# Patient Record
Sex: Female | Born: 1965 | Race: White | Hispanic: No | Marital: Married | Smoking: Former smoker
Health system: Southern US, Community
[De-identification: ages and names within clinical notes are randomized; demographics above are authoritative.]

## PROBLEM LIST (undated history)

## (undated) DIAGNOSIS — E162 Hypoglycemia, unspecified: Secondary | ICD-10-CM

## (undated) DIAGNOSIS — D649 Anemia, unspecified: Secondary | ICD-10-CM

## (undated) HISTORY — DX: Anemia, unspecified: D64.9

## (undated) HISTORY — DX: Hypoglycemia, unspecified: E16.2

---

## 1998-03-31 ENCOUNTER — Emergency Department (HOSPITAL_COMMUNITY): Admission: EM | Admit: 1998-03-31 | Discharge: 1998-03-31 | Payer: Self-pay | Admitting: Family Medicine

## 1998-04-05 ENCOUNTER — Emergency Department (HOSPITAL_COMMUNITY): Admission: EM | Admit: 1998-04-05 | Discharge: 1998-04-05 | Payer: Self-pay | Admitting: Internal Medicine

## 1998-04-25 ENCOUNTER — Emergency Department (HOSPITAL_COMMUNITY): Admission: EM | Admit: 1998-04-25 | Discharge: 1998-04-25 | Payer: Self-pay | Admitting: Family Medicine

## 1998-09-06 ENCOUNTER — Emergency Department (HOSPITAL_COMMUNITY): Admission: EM | Admit: 1998-09-06 | Discharge: 1998-09-06 | Payer: Self-pay | Admitting: Emergency Medicine

## 1999-05-14 ENCOUNTER — Emergency Department (HOSPITAL_COMMUNITY): Admission: EM | Admit: 1999-05-14 | Discharge: 1999-05-14 | Payer: Self-pay | Admitting: Emergency Medicine

## 1999-05-14 ENCOUNTER — Encounter: Payer: Self-pay | Admitting: Emergency Medicine

## 1999-08-11 ENCOUNTER — Emergency Department (HOSPITAL_COMMUNITY): Admission: EM | Admit: 1999-08-11 | Discharge: 1999-08-11 | Payer: Self-pay | Admitting: Emergency Medicine

## 1999-09-16 ENCOUNTER — Emergency Department (HOSPITAL_COMMUNITY): Admission: EM | Admit: 1999-09-16 | Discharge: 1999-09-16 | Payer: Self-pay | Admitting: Internal Medicine

## 1999-11-25 ENCOUNTER — Emergency Department (HOSPITAL_COMMUNITY): Admission: EM | Admit: 1999-11-25 | Discharge: 1999-11-25 | Payer: Self-pay | Admitting: Emergency Medicine

## 2001-03-22 ENCOUNTER — Emergency Department (HOSPITAL_COMMUNITY): Admission: EM | Admit: 2001-03-22 | Discharge: 2001-03-22 | Payer: Self-pay | Admitting: Emergency Medicine

## 2001-04-27 ENCOUNTER — Emergency Department (HOSPITAL_COMMUNITY): Admission: EM | Admit: 2001-04-27 | Discharge: 2001-04-27 | Payer: Self-pay

## 2002-05-29 ENCOUNTER — Emergency Department (HOSPITAL_COMMUNITY): Admission: EM | Admit: 2002-05-29 | Discharge: 2002-05-29 | Payer: Self-pay | Admitting: Emergency Medicine

## 2002-12-25 ENCOUNTER — Emergency Department (HOSPITAL_COMMUNITY): Admission: EM | Admit: 2002-12-25 | Discharge: 2002-12-25 | Payer: Self-pay | Admitting: Emergency Medicine

## 2004-02-03 ENCOUNTER — Emergency Department (HOSPITAL_COMMUNITY): Admission: EM | Admit: 2004-02-03 | Discharge: 2004-02-03 | Payer: Self-pay | Admitting: Emergency Medicine

## 2004-02-04 ENCOUNTER — Emergency Department (HOSPITAL_COMMUNITY): Admission: EM | Admit: 2004-02-04 | Discharge: 2004-02-04 | Payer: Self-pay | Admitting: Emergency Medicine

## 2006-01-12 ENCOUNTER — Other Ambulatory Visit: Admission: RE | Admit: 2006-01-12 | Discharge: 2006-01-12 | Payer: Self-pay | Admitting: Obstetrics and Gynecology

## 2007-12-16 ENCOUNTER — Emergency Department (HOSPITAL_COMMUNITY): Admission: EM | Admit: 2007-12-16 | Discharge: 2007-12-16 | Payer: Self-pay | Admitting: Emergency Medicine

## 2011-01-16 ENCOUNTER — Emergency Department (HOSPITAL_COMMUNITY)
Admission: EM | Admit: 2011-01-16 | Discharge: 2011-01-16 | Disposition: A | Discharge status: Home / Self Care | Payer: BC Managed Care – PPO | Attending: Emergency Medicine | Admitting: Emergency Medicine

## 2011-01-16 ENCOUNTER — Emergency Department (INDEPENDENT_AMBULATORY_CARE_PROVIDER_SITE_OTHER): Payer: BC Managed Care – PPO

## 2011-01-16 ENCOUNTER — Emergency Department (HOSPITAL_BASED_OUTPATIENT_CLINIC_OR_DEPARTMENT_OTHER)
Admission: EM | Admit: 2011-01-16 | Discharge: 2011-01-16 | Disposition: A | Discharge status: Home / Self Care | Payer: BC Managed Care – PPO | Attending: Emergency Medicine | Admitting: Emergency Medicine

## 2011-01-16 DIAGNOSIS — M545 Low back pain: Secondary | ICD-10-CM

## 2011-01-16 DIAGNOSIS — M549 Dorsalgia, unspecified: Secondary | ICD-10-CM | POA: Insufficient documentation

## 2011-06-10 LAB — URINALYSIS, ROUTINE W REFLEX MICROSCOPIC
Glucose, UA: NEGATIVE
Nitrite: NEGATIVE
Protein, ur: 100 — AB
Specific Gravity, Urine: >1.046 — ABNORMAL HIGH
Urobilinogen, UA: 1
pH: 5.5

## 2011-06-10 LAB — PREGNANCY, URINE: Preg Test, Ur: NEGATIVE

## 2011-06-10 LAB — BASIC METABOLIC PANEL
BUN: 13
CO2: 24
Calcium: 9.1
Chloride: 103
Creatinine, Ser: 0.68
GFR calc Af Amer: >60
GFR calc non Af Amer: >60
Glucose, Bld: 92
Potassium: 4.4
Sodium: 134 — ABNORMAL LOW

## 2011-06-10 LAB — CBC
HCT: 30.9 — ABNORMAL LOW
Hemoglobin: 9.8 — ABNORMAL LOW
RBC: 4.52
WBC: 13.4 — ABNORMAL HIGH

## 2011-06-10 LAB — URINE MICROSCOPIC-ADD ON

## 2011-12-12 ENCOUNTER — Encounter (HOSPITAL_COMMUNITY): Payer: Self-pay

## 2011-12-12 ENCOUNTER — Emergency Department (HOSPITAL_COMMUNITY)
Admission: EM | Admit: 2011-12-12 | Discharge: 2011-12-12 | Disposition: A | Discharge status: Home / Self Care | Payer: BC Managed Care – PPO | Attending: Emergency Medicine | Admitting: Emergency Medicine

## 2011-12-12 DIAGNOSIS — K117 Disturbances of salivary secretion: Secondary | ICD-10-CM | POA: Insufficient documentation

## 2011-12-12 DIAGNOSIS — K5289 Other specified noninfective gastroenteritis and colitis: Secondary | ICD-10-CM | POA: Insufficient documentation

## 2011-12-12 DIAGNOSIS — E860 Dehydration: Secondary | ICD-10-CM | POA: Insufficient documentation

## 2011-12-12 DIAGNOSIS — R197 Diarrhea, unspecified: Secondary | ICD-10-CM | POA: Insufficient documentation

## 2011-12-12 DIAGNOSIS — R000 Tachycardia, unspecified: Secondary | ICD-10-CM | POA: Insufficient documentation

## 2011-12-12 DIAGNOSIS — K529 Noninfective gastroenteritis and colitis, unspecified: Secondary | ICD-10-CM

## 2011-12-12 DIAGNOSIS — R109 Unspecified abdominal pain: Secondary | ICD-10-CM | POA: Insufficient documentation

## 2011-12-12 DIAGNOSIS — R112 Nausea with vomiting, unspecified: Secondary | ICD-10-CM | POA: Insufficient documentation

## 2011-12-12 MED ORDER — ONDANSETRON 8 MG PO TBDP: 8.0000 mg | ORAL_TABLET | Freq: Three times a day (TID) | ORAL | Status: AC | PRN

## 2011-12-12 MED ORDER — DIPHENOXYLATE-ATROPINE 2.5-0.025 MG PO TABS: 1.0000 | ORAL_TABLET | Freq: Four times a day (QID) | ORAL | Status: AC | PRN

## 2011-12-12 MED ORDER — SODIUM CHLORIDE 0.9 % IV BOLUS (SEPSIS): 2000.0000 mL | Freq: Once | INTRAVENOUS | Status: DC

## 2011-12-12 MED ORDER — LACTATED RINGERS IV BOLUS (SEPSIS)
2000.0000 mL | Freq: Once | INTRAVENOUS | Status: AC
  Administered 2011-12-12: 1000 mL via INTRAVENOUS

## 2011-12-12 MED ORDER — ONDANSETRON HCL 4 MG/2ML IJ SOLN
4.0000 mg | Freq: Once | INTRAMUSCULAR | Status: AC
Start: 2011-12-12 — End: 2011-12-12
  Administered 2011-12-12: 4 mg via INTRAVENOUS
  Filled 2011-12-12: qty 2

## 2011-12-12 MED ORDER — DIPHENOXYLATE-ATROPINE 2.5-0.025 MG PO TABS: 2.0000 | ORAL_TABLET | Freq: Once | ORAL | Status: DC

## 2011-12-12 MED ORDER — SODIUM CHLORIDE 0.9 % IV BOLUS (SEPSIS)
2000.0000 mL | Freq: Once | INTRAVENOUS | Status: AC
  Administered 2011-12-12: 1000 mL via INTRAVENOUS

## 2011-12-12 NOTE — ED Notes (Signed)
Pt given ice chips for PO trial.

## 2011-12-12 NOTE — ED Notes (Signed)
Pt states that she woke up and was vomiting and having diarrhea about 11:30pm

## 2011-12-12 NOTE — ED Provider Notes (Signed)
History     CSN: [id number]  Arrival date & time 12/12/11  0207   First MD Initiated Contact with Patient 12/12/11 0405      Chief Complaint  Patient presents with  . N/V/D     (Consider location/radiation/quality/duration/timing/severity/associated sxs/prior treatment) HPI This is a 46 year old white female who developed the sudden onset of nausea, vomiting and diarrhea about 11:30 PM yesterday. She describes the symptoms as severe. She has lost a lot of fluid. There been no mitigating or exacerbating factors. She's had abdominal cramping with this. She has a dry mouth. She's not aware if she's had a fever. She does feel chilled.  History reviewed. No pertinent past medical history.  History reviewed. No pertinent past surgical history.  History reviewed. No pertinent family history.  History  Substance Use Topics  . Smoking status: Not on file  . Smokeless tobacco: Not on file  . Alcohol Use: No    OB History    Grav Para Term Preterm Abortions TAB SAB Ect Mult Living                  Review of Systems  All other systems reviewed and are negative.    Allergies  Tramadol  Home Medications  No current outpatient prescriptions on file.  BP 151/101  Pulse 94  Temp(Src) 98.1 F (36.7 C) (Oral)  Resp 20  SpO2 100%  LMP 12/05/2011  Physical Exam General: Well-developed, well-nourished female in no acute distress; appearance consistent with age of record HENT: normocephalic, atraumatic; eyes appear somewhat sunken; dry mucous membranes and chapped lips Eyes: pupils equal round and reactive to light; extraocular muscles intact Neck: supple Heart: regular rate and rhythm; tachycardic, though tachycardia not noted in vitals Lungs: clear to auscultation bilaterally Abdomen: soft; nondistended; mild diffuse tenderness; no masses or hepatosplenomegaly; bowel sounds hyperactive Extremities: No deformity; full range of motion; pulses normal Neurologic: Awake, alert  and oriented; motor function intact in all extremities and symmetric; no facial droop Skin: Warm and dry    ED Course  Procedures (including critical care time)     MDM   Nursing notes and vitals signs, including pulse oximetry, reviewed.  Summary of this visit's results, reviewed by myself:  Labs:  Results for orders placed during the hospital encounter of 12/12/11  GLUCOSE, CAPILLARY      Component Value Range   Glucose-Capillary 94  70 - 99 (mg/dL)   8:65 AM Feels better after 2 L normal saline bolus. States her mouth still feels dry. Eating ice chips without emesis. Will give additional 2 L of lactated Ringer's. Dr. Judd Lien will make disposition.           Hanley Seamen, MD 12/12/11 [phone]

## 2011-12-12 NOTE — ED Notes (Signed)
2nd Liter NS WO hung, pt given warm blankets for comfort

## 2018-10-30 DIAGNOSIS — J302 Other seasonal allergic rhinitis: Secondary | ICD-10-CM | POA: Diagnosis not present

## 2018-10-30 DIAGNOSIS — R51 Headache: Secondary | ICD-10-CM | POA: Diagnosis not present

## 2018-10-30 DIAGNOSIS — J019 Acute sinusitis, unspecified: Secondary | ICD-10-CM | POA: Diagnosis not present

## 2018-11-28 NOTE — Progress Notes (Signed)
PCP: Patient, No Pcp Per   Chief Complaint  Patient presents with  . Gynecologic Exam    HPI:      Erin Huynh is a 53 y.o. No obstetric history on file. who LMP was Patient's last menstrual period was 11/20/2018 (exact date)., presents today for her NP annual examination.  Her menses are irregular due to perimenopause for the past yr. Menses can be monthly to every few months. Sometimes has 2 days of light spotting only, sometimes has real period for 6 days with dime to quarter sized clots. Usually has dysmen, improved with NSAIDs. No BTB. Pt's current menses heavier and longer, changing pads/tampons every few hrs with occas gushes and more clots.  Also has intermittent bloating, with and without menses. No constipation.  She does not have vasomotor sx.   Sex activity: single partner, She does not have vaginal dryness.  Last Pap: not recent; no hx of abn.  Last mammogram: not recent There is no FH of breast cancer. There is no FH of ovarian cancer. The patient does not do self-breast exams.  Colonoscopy: never. Occas has epigastric pain, not necessarily related to eating.   Tobacco use: quit tobacco  Alcohol use: none Exercise: very active  She does get adequate calcium but not Vitamin D in her diet.  Labs at work.   Past Medical History:  Diagnosis Date  . Anemia   . Hypoglycemia     Past Surgical History:  Procedure Laterality Date  . CESAREAN SECTION     3244,0102    Family History  Problem Relation Age of Onset  . Colon cancer Maternal Grandfather 18  . Skin cancer Paternal Grandfather 68  . Breast cancer Neg Hx     Social History   Socioeconomic History  . Marital status: Married    Spouse name: Not on file  . Number of children: Not on file  . Years of education: Not on file  . Highest education level: Not on file  Occupational History  . Not on file  Social Needs  . Financial resource strain: Not on file  . Food insecurity:    Worry:  Not on file    Inability: Not on file  . Transportation needs:    Medical: Not on file    Non-medical: Not on file  Tobacco Use  . Smoking status: Former Games developer  . Smokeless tobacco: Never Used  Substance and Sexual Activity  . Alcohol use: No  . Drug use: Never  . Sexual activity: Not Currently    Birth control/protection: None  Lifestyle  . Physical activity:    Days per week: Not on file    Minutes per session: Not on file  . Stress: Not on file  Relationships  . Social connections:    Talks on phone: Not on file    Gets together: Not on file    Attends religious service: Not on file    Active member of club or organization: Not on file    Attends meetings of clubs or organizations: Not on file    Relationship status: Not on file  . Intimate partner violence:    Fear of current or ex partner: Not on file    Emotionally abused: Not on file    Physically abused: Not on file    Forced sexual activity: Not on file  Other Topics Concern  . Not on file  Social History Narrative  . Not on file    No  outpatient medications prior to visit.   No facility-administered medications prior to visit.        ROS:  Review of Systems  Constitutional: Negative for fatigue, fever and unexpected weight change.  Respiratory: Negative for cough, shortness of breath and wheezing.   Cardiovascular: Negative for chest pain, palpitations and leg swelling.  Gastrointestinal: Negative for blood in stool, constipation, diarrhea, nausea and vomiting.  Endocrine: Negative for cold intolerance, heat intolerance and polyuria.  Genitourinary: Positive for difficulty urinating and vaginal bleeding. Negative for dyspareunia, dysuria, flank pain, frequency, genital sores, hematuria, menstrual problem, pelvic pain, urgency, vaginal discharge and vaginal pain.  Musculoskeletal: Positive for arthralgias. Negative for back pain, joint swelling and myalgias.  Skin: Negative for rash.  Neurological:  Positive for headaches. Negative for dizziness, syncope, light-headedness and numbness.  Hematological: Negative for adenopathy.  Psychiatric/Behavioral: Positive for agitation. Negative for confusion, sleep disturbance and suicidal ideas. The patient is not nervous/anxious.    BREAST: No symptoms    Objective: BP 140/90   Pulse 77   Ht 4\' 8"  (1.422 m)   Wt 105 lb (47.6 kg)   LMP 11/20/2018 (Exact Date)   BMI 23.54 kg/m    Physical Exam Constitutional:      Appearance: She is well-developed.  Genitourinary:     Vulva, uterus, right adnexa and left adnexa normal.     No vulval lesion or tenderness noted.     Vaginal bleeding present.     No vaginal discharge, erythema or tenderness.     Cervical bleeding present.     No cervical polyp.     Uterus is not enlarged or tender.     No uterine mass detected.    No right or left adnexal mass present.     Right adnexa not tender.     Left adnexa not tender.  Neck:     Musculoskeletal: Normal range of motion.     Thyroid: No thyromegaly.  Cardiovascular:     Rate and Rhythm: Normal rate and regular rhythm.     Heart sounds: Normal heart sounds. No murmur.  Pulmonary:     Effort: Pulmonary effort is normal.     Breath sounds: Normal breath sounds.  Chest:     Breasts:        Right: No mass, nipple discharge, skin change or tenderness.        Left: No mass, nipple discharge, skin change or tenderness.  Abdominal:     Palpations: Abdomen is soft.     Tenderness: There is no abdominal tenderness. There is no guarding.  Musculoskeletal: Normal range of motion.  Neurological:     General: No focal deficit present.     Mental Status: She is alert and oriented to person, place, and time.     Cranial Nerves: No cranial nerve deficit.  Skin:    General: Skin is warm and dry.  Psychiatric:        Mood and Affect: Mood normal.        Behavior: Behavior normal.        Thought Content: Thought content normal.        Judgment:  Judgment normal.  Vitals signs reviewed.     Assessment/Plan:  Encounter for annual routine gynecological examination  Cervical cancer screening - Plan: Cytology - PAP  Screening for HPV (human papillomavirus) - Plan: Cytology - PAP  Screening for breast cancer - Pt to sched mammo - Plan: MM 3D SCREEN BREAST BILATERAL  Irregular menses -  Due to perimenopause. F/u prn DUB  Perimenopause  Screening for colon cancer - Refer to GI for scr colonoscopy due to age. - Plan: Ambulatory referral to Gastroenterology         GYN counsel breast self exam, mammography screening, menopause, adequate intake of calcium and vitamin D, diet and exercise    F/U  Return in about 1 year (around 11/29/2019).  Josceline Chenard B. Zakk Borgen, PA-C 11/29/2018 9:28 AM

## 2018-11-29 ENCOUNTER — Other Ambulatory Visit: Payer: Self-pay

## 2018-11-29 ENCOUNTER — Other Ambulatory Visit (HOSPITAL_COMMUNITY)
Admission: RE | Admit: 2018-11-29 | Discharge: 2018-11-29 | Disposition: A | Discharge status: Home / Self Care | Payer: BLUE CROSS/BLUE SHIELD | Source: Ambulatory Visit | Attending: Obstetrics and Gynecology | Admitting: Obstetrics and Gynecology

## 2018-11-29 ENCOUNTER — Ambulatory Visit (INDEPENDENT_AMBULATORY_CARE_PROVIDER_SITE_OTHER): Payer: BLUE CROSS/BLUE SHIELD | Admitting: Obstetrics and Gynecology

## 2018-11-29 ENCOUNTER — Encounter: Payer: Self-pay | Admitting: Obstetrics and Gynecology

## 2018-11-29 VITALS — BP 140/90 | HR 77 | Ht <= 58 in | Wt 105.0 lb

## 2018-11-29 DIAGNOSIS — Z124 Encounter for screening for malignant neoplasm of cervix: Secondary | ICD-10-CM | POA: Insufficient documentation

## 2018-11-29 DIAGNOSIS — Z1151 Encounter for screening for human papillomavirus (HPV): Secondary | ICD-10-CM

## 2018-11-29 DIAGNOSIS — Z01419 Encounter for gynecological examination (general) (routine) without abnormal findings: Secondary | ICD-10-CM | POA: Diagnosis not present

## 2018-11-29 DIAGNOSIS — Z113 Encounter for screening for infections with a predominantly sexual mode of transmission: Secondary | ICD-10-CM | POA: Diagnosis not present

## 2018-11-29 DIAGNOSIS — Z1239 Encounter for other screening for malignant neoplasm of breast: Secondary | ICD-10-CM

## 2018-11-29 DIAGNOSIS — Z1211 Encounter for screening for malignant neoplasm of colon: Secondary | ICD-10-CM

## 2018-11-29 DIAGNOSIS — N951 Menopausal and female climacteric states: Secondary | ICD-10-CM

## 2018-11-29 DIAGNOSIS — N926 Irregular menstruation, unspecified: Secondary | ICD-10-CM

## 2018-11-29 NOTE — Patient Instructions (Addendum)
I value your feedback and entrusting us with your care. If you get a [facility] patient survey, I would appreciate you taking the time to let us know about your experience today. Thank you!  [facility]: [phone]    

## 2018-12-03 LAB — CYTOLOGY - PAP
DIAGNOSIS: UNDETERMINED — AB
HPV: NOT DETECTED

## 2018-12-06 ENCOUNTER — Encounter: Payer: Self-pay | Admitting: Obstetrics and Gynecology

## 2018-12-27 ENCOUNTER — Encounter: Payer: Self-pay | Admitting: *Deleted

## 2019-08-27 DIAGNOSIS — R509 Fever, unspecified: Secondary | ICD-10-CM | POA: Diagnosis not present

## 2019-08-27 DIAGNOSIS — Z20828 Contact with and (suspected) exposure to other viral communicable diseases: Secondary | ICD-10-CM | POA: Diagnosis not present

## 2019-08-27 DIAGNOSIS — J3489 Other specified disorders of nose and nasal sinuses: Secondary | ICD-10-CM | POA: Diagnosis not present

## 2021-06-01 ENCOUNTER — Encounter (HOSPITAL_BASED_OUTPATIENT_CLINIC_OR_DEPARTMENT_OTHER): Payer: Self-pay

## 2021-06-01 ENCOUNTER — Emergency Department (HOSPITAL_BASED_OUTPATIENT_CLINIC_OR_DEPARTMENT_OTHER)
Admission: EM | Admit: 2021-06-01 | Discharge: 2021-06-01 | Disposition: A | Discharge status: Home / Self Care | Payer: BC Managed Care – PPO | Attending: Emergency Medicine | Admitting: Emergency Medicine

## 2021-06-01 ENCOUNTER — Emergency Department (HOSPITAL_BASED_OUTPATIENT_CLINIC_OR_DEPARTMENT_OTHER): Payer: BC Managed Care – PPO

## 2021-06-01 ENCOUNTER — Ambulatory Visit: Admission: EM | Admit: 2021-06-01 | Discharge: 2021-06-01 | Payer: BC Managed Care – PPO

## 2021-06-01 ENCOUNTER — Other Ambulatory Visit: Payer: Self-pay

## 2021-06-01 DIAGNOSIS — Z87891 Personal history of nicotine dependence: Secondary | ICD-10-CM | POA: Insufficient documentation

## 2021-06-01 DIAGNOSIS — G8929 Other chronic pain: Secondary | ICD-10-CM

## 2021-06-01 DIAGNOSIS — R519 Headache, unspecified: Secondary | ICD-10-CM | POA: Diagnosis not present

## 2021-06-01 DIAGNOSIS — G43809 Other migraine, not intractable, without status migrainosus: Secondary | ICD-10-CM | POA: Diagnosis not present

## 2021-06-01 DIAGNOSIS — G43909 Migraine, unspecified, not intractable, without status migrainosus: Secondary | ICD-10-CM | POA: Insufficient documentation

## 2021-06-01 LAB — CBC WITH DIFFERENTIAL/PLATELET
Abs Immature Granulocytes: 0.02 10*3/uL (ref 0.00–0.07)
Basophils Absolute: 0 10*3/uL (ref 0.0–0.1)
Basophils Relative: 0 %
Eosinophils Absolute: 0.2 10*3/uL (ref 0.0–0.5)
Eosinophils Relative: 2 %
HCT: 40 % (ref 36.0–46.0)
Hemoglobin: 13 g/dL (ref 12.0–15.0)
Immature Granulocytes: 0 %
Lymphocytes Relative: 22 %
Lymphs Abs: 2.1 10*3/uL (ref 0.7–4.0)
MCH: 28.3 pg (ref 26.0–34.0)
MCHC: 32.5 g/dL (ref 30.0–36.0)
MCV: 87.1 fL (ref 80.0–100.0)
Monocytes Absolute: 0.4 10*3/uL (ref 0.1–1.0)
Monocytes Relative: 4 %
Neutro Abs: 7 10*3/uL (ref 1.7–7.7)
Neutrophils Relative %: 72 %
Platelets: 258 10*3/uL (ref 150–400)
RBC: 4.59 MIL/uL (ref 3.87–5.11)
RDW: 15.8 % — ABNORMAL HIGH (ref 11.5–15.5)
WBC: 9.8 10*3/uL (ref 4.0–10.5)
nRBC: 0 % (ref 0.0–0.2)

## 2021-06-01 LAB — COMPREHENSIVE METABOLIC PANEL
ALT: 15 U/L (ref 0–44)
AST: 17 U/L (ref 15–41)
Albumin: 4.2 g/dL (ref 3.5–5.0)
Alkaline Phosphatase: 56 U/L (ref 38–126)
Anion gap: 9 (ref 5–15)
BUN: 13 mg/dL (ref 6–20)
CO2: 27 mmol/L (ref 22–32)
Calcium: 9.9 mg/dL (ref 8.9–10.3)
Chloride: 103 mmol/L (ref 98–111)
Creatinine, Ser: 0.55 mg/dL (ref 0.44–1.00)
GFR, Estimated: >60 mL/min (ref >60)
Glucose, Bld: 92 mg/dL (ref 70–99)
Potassium: 3.9 mmol/L (ref 3.5–5.1)
Sodium: 139 mmol/L (ref 135–145)
Total Bilirubin: 0.2 mg/dL — ABNORMAL LOW (ref 0.3–1.2)
Total Protein: 7.1 g/dL (ref 6.5–8.1)

## 2021-06-01 MED ORDER — DIPHENHYDRAMINE HCL 50 MG/ML IJ SOLN
50.0000 mg | Freq: Once | INTRAMUSCULAR | Status: AC
Start: 2021-06-01 — End: 2021-06-01
  Administered 2021-06-01: 50 mg via INTRAVENOUS
  Filled 2021-06-01: qty 1

## 2021-06-01 MED ORDER — IOHEXOL 350 MG/ML SOLN
75.0000 mL | Freq: Once | INTRAVENOUS | Status: AC | PRN
  Administered 2021-06-01: 75 mL via INTRAVENOUS

## 2021-06-01 MED ORDER — METOCLOPRAMIDE HCL 5 MG/ML IJ SOLN
10.0000 mg | Freq: Once | INTRAMUSCULAR | Status: AC
  Administered 2021-06-01: 10 mg via INTRAVENOUS
  Filled 2021-06-01: qty 2

## 2021-06-01 NOTE — ED Notes (Signed)
Patient is being discharged from the Urgent Care and sent to the Emergency Department via POV . Per Boddu, patient is in need of higher level of care to rule out aneurysm. Patient is aware and verbalizes understanding of plan of care.  Vitals:   06/01/21 1328 06/01/21 1331  BP:  130/86  Pulse: 86 75  Resp: 16 16  Temp: 98.1 F (36.7 C) 98.3 F (36.8 C)  SpO2: 96% 96%

## 2021-06-01 NOTE — ED Notes (Signed)
Patient transported to CT 

## 2021-06-01 NOTE — ED Provider Notes (Signed)
Patient presents with concerns for neck injury/aneurysm due to severe headache which has worsened over the last 2 weeks. Patient reports HA over the last 2 months. No known injury, but unsure.  Will go to the ED for evaluation.  No charge visit. Stable on discharge.   Amalia Greenhouse, FNP 06/01/21 1340

## 2021-06-01 NOTE — Discharge Instructions (Signed)
Schedule a follow-up with neurology for additional evaluation of headaches.  Your work-up today was reassuring and there were no signs of aneurysm.  Continue taking Excedrin and Tylenol as needed.

## 2021-06-01 NOTE — ED Triage Notes (Signed)
Patient presents to Urgent Care with complaints of headache and neck pain x 2 months from injuring her neck at work. Pt states she has had neck pain and migraines. Treating migraines with Excedrin migraine.

## 2021-06-01 NOTE — Discharge Instructions (Addendum)
Your current condition warrants further evaluation and/or treatment which exceed services available to you in this urgent care setting. I have discussed with you your currrent condition and the need for further evaluation and/or treatment in an emergency department setting. In response to my medical recommendation, you have opted to go to the emergency department. 
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
## 2021-06-01 NOTE — ED Triage Notes (Signed)
Pt c/o headache and neck pain X2 months. Treating migraines with Excedrin migraine with no relief. NAD in triage. Pt ambulatory with steady gate. Pt denies light sensitivity but does report on and off "blurriness"

## 2021-06-01 NOTE — ED Notes (Signed)
Dc home ambulatory. Pt voiced understanding of dc instructions.

## 2021-06-06 ENCOUNTER — Ambulatory Visit (INDEPENDENT_AMBULATORY_CARE_PROVIDER_SITE_OTHER): Payer: BC Managed Care – PPO | Admitting: Neurology

## 2021-06-06 ENCOUNTER — Encounter: Payer: Self-pay | Admitting: Neurology

## 2021-06-06 VITALS — BP 113/75 | HR 76 | Ht <= 58 in | Wt 99.0 lb

## 2021-06-06 DIAGNOSIS — G44221 Chronic tension-type headache, intractable: Secondary | ICD-10-CM

## 2021-06-06 DIAGNOSIS — Z0000 Encounter for general adult medical examination without abnormal findings: Secondary | ICD-10-CM

## 2021-06-06 MED ORDER — SUMATRIPTAN SUCCINATE 50 MG PO TABS: 50.0000 mg | ORAL_TABLET | ORAL | 0 refills | Status: DC | PRN

## 2021-06-06 MED ORDER — METHYLPREDNISOLONE 4 MG PO TBPK: ORAL_TABLET | ORAL | 0 refills | Status: DC

## 2021-06-06 MED ORDER — AMITRIPTYLINE HCL 25 MG PO TABS
25.0000 mg | ORAL_TABLET | Freq: Every day | ORAL | 1 refills | Status: DC
Start: 2021-06-06 — End: 2021-09-10

## 2021-06-06 NOTE — Patient Instructions (Addendum)
Stop Excedrin   Start Medrol dose pack as below  Day 1: Take 6 tabs in the morning  Day 2: Take 5 tabs in the morning  Day 3: Take 4 tabs in the morning  Day 4: Take 3 tabs in the morning  Day 5: Take 2 tabs in the morning  Day 6: Tale 1 tab in the morning  Start Elavil as below:  Week 1: Take 1/2 tablet nightly  Week 2: Take 1 tablet nightly  Week 3: Increase to 2 tablets nightly   Start Sumatriptan for severe pain, can take an additional table after 2 hours in the pain not resolved   Brain MRI with and without contrast, I will contact you to discuss MRI results   Return in 3 months

## 2021-06-06 NOTE — Progress Notes (Signed)
[facility]  PATIENT: Erin Huynh DOB: [birth year]  REFERRING CLINICIAN: No ref. provider found HISTORY FROM: Patient  REASON FOR VISIT: Daily Headaches    HISTORICAL  CHIEF COMPLAINT:  Chief Complaint  Patient presents with   New Patient (Initial Visit)    Rm 12, states her her headache will not go away, takes Excedrin every 4-6 hrs     HISTORY OF PRESENT ILLNESS:  This is a 55 year old woman with no reported past medical history who is presenting with daily headaches.  Patient stated for the past 6 to 7 months has been having migraine headaches but in the last 2 months has been more frequent.  She is having daily headaches.  Headaches usually start in the back of the neck on the right side, described as sharp pain that would radiate to the top of the head.  On occasion she will have bifrontal headaches.  She reports has been taking Excedrin migraine headache every 4-6 hours every day for her headaches.   She is also complaining of right-sided neck pain which is getting worse.  With the headaches she has nausea but only on occasion but no vomiting no photophobia no phonophobia.  She was seen in the ED recently on this September 17 for the same headaches, had a CTA head and neck which was negative did not show any evidence of aneurysm.   On further questioning patient stated her mood is little low, she is undergoing a lot of stress.  She left her husband 2 years ago, she is living with her brother in the basement.  Stated that she started her divorce paper 73-monthago but is been difficult for her.  She currently works second shift between 20 11 PM.  Also reported her sleep is terrible preparation.  She goes to bed around midnight and she will wake up almost every hour. She usually does not see a doctor, she does not have a primary care doctor currently.   Headache History and Characteristics: Onset: 7 months ago, worse in the past 2 months. Location: back of  head, in the right side, will shoot pain on top. Every once a while, headaches are frontal Quality: stabbing, throbbing.  Intensity: 7/10.  Duration: Constant for the past 2 months,  Migrainous Features: nausea  Aura: No  History of brain injury or tumor: No  Family history: Mother with migraines, grandmother (Brain aneurysm) Motion sickness: no Cardiac history: no  OTC: tylenol, Excedrin, Mucinex   Caffeine: yes  Sleep: Not  good, work second shift 3 -11, goes to bed by midnight, but will wake every hour Mood/ Stress: Live with brother, going thru a divorce,   Prior prophylaxis: Propranolol: No  Verapamil:No TCA: No Topamax: No Depakote: No Effexor: No Cymbalta: No Neurontin:No  Prior abortives: Triptan: No Anti-emetic: No Steroids: No Ergotamine suppository: No  Prior interventions Trigger point injections: botox injections:   OTHER MEDICAL CONDITIONS: None    REVIEW OF SYSTEMS: Full 14 system review of systems performed and negative with exception of: as noted in the HPI  ALLERGIES: Allergies  Allergen Reactions   Tramadol     HOME MEDICATIONS: Outpatient Medications Prior to Visit  Medication Sig Dispense Refill   aspirin-acetaminophen-caffeine (EXCEDRIN MIGRAINE) 250-250-65 MG tablet Take by mouth every 6 (six) hours as needed for headache.     No facility-administered medications prior to visit.    PAST MEDICAL HISTORY: Past Medical History:  Diagnosis Date   Anemia    Hypoglycemia  PAST SURGICAL HISTORY: Past Surgical History:  Procedure Laterality Date   CESAREAN SECTION     [phone]    FAMILY HISTORY: Family History  Problem Relation Age of Onset   Colon cancer Maternal Grandfather 49   Skin cancer Paternal Grandfather 70   Breast cancer Neg Hx     SOCIAL HISTORY: Social History   Socioeconomic History   Marital status: Married    Spouse name: Not on file   Number of children: Not on file   Years of education: Not on  file   Highest education level: Not on file  Occupational History   Not on file  Tobacco Use   Smoking status: Former   Smokeless tobacco: Never  Vaping Use   Vaping Use: Never used  Substance and Sexual Activity   Alcohol use: No   Drug use: Never   Sexual activity: Not Currently    Birth control/protection: None  Other Topics Concern   Not on file  Social History Narrative   Not on file   Social Determinants of Health   Financial Resource Strain: Not on file  Food Insecurity: Not on file  Transportation Needs: Not on file  Physical Activity: Not on file  Stress: Not on file  Social Connections: Not on file  Intimate Partner Violence: Not on file     PHYSICAL EXAM  GENERAL EXAM/CONSTITUTIONAL: Vitals:  Vitals:   06/06/21 1256  BP: 113/75  Pulse: 76  Weight: 99 lb (44.9 kg)  Height: 4' 8" (1.422 m)   Body mass index is 22.2 kg/m. Wt Readings from Last 3 Encounters:  06/06/21 99 lb (44.9 kg)  06/01/21 95 lb (43.1 kg)  11/29/18 105 lb (47.6 kg)   Patient is in no distress; well developed, nourished and groomed; neck is supple but tearful and crying during examination  EYES: Pupils round and reactive to light, Visual fields full to confrontation, Extraocular movements intacts,   MUSCULOSKELETAL: Gait, strength, tone, movements noted in Neurologic exam below  NEUROLOGIC: MENTAL STATUS:  awake, alert, oriented to person, place and time recent and remote memory intact normal attention and concentration language fluent, comprehension intact, naming intact fund of knowledge appropriate  CRANIAL NERVE:  2nd - no papilledema or hemorrhages on fundoscopic exam 2nd, 3rd, 4th, 6th - pupils equal and reactive to light, visual fields full to confrontation, extraocular muscles intact, no nystagmus 5th - facial sensation symmetric 7th - facial strength symmetric 8th - hearing intact 9th - palate elevates symmetrically, uvula midline 11th - shoulder shrug  symmetric 12th - tongue protrusion midline  MOTOR:  normal bulk and tone, full strength in the BUE, BLE  SENSORY:  normal and symmetric to light touch, pinprick, temperature, vibration  COORDINATION:  finger-nose-finger, fine finger movements normal  REFLEXES:  deep tendon reflexes present and symmetric  GAIT/STATION:  normal    DIAGNOSTIC DATA (LABS, IMAGING, TESTING) - I reviewed patient records, labs, notes, testing and imaging myself where available.  Lab Results  Component Value Date   WBC 9.8 06/01/2021   HGB 13.0 06/01/2021   HCT 40.0 06/01/2021   MCV 87.1 06/01/2021   PLT 258 06/01/2021      Component Value Date/Time   NA 139 06/01/2021 1534   K 3.9 06/01/2021 1534   CL 103 06/01/2021 1534   CO2 27 06/01/2021 1534   GLUCOSE 92 06/01/2021 1534   BUN 13 06/01/2021 1534   CREATININE 0.55 06/01/2021 1534   CALCIUM 9.9 06/01/2021 1534   PROT 7.1 06/01/2021  1534   ALBUMIN 4.2 06/01/2021 1534   AST 17 06/01/2021 1534   ALT 15 06/01/2021 1534   ALKPHOS 56 06/01/2021 1534   BILITOT 0.2 (L) 06/01/2021 1534   GFRNONAA >60 06/01/2021 1534   GFRAA  12/16/2007 1030    >60        The eGFR has been calculated using the MDRD equation. This calculation has not been validated in all clinical   No results found for: CHOL, HDL, LDLCALC, LDLDIRECT, TRIG, CHOLHDL No results found for: HGBA1C No results found for: VITAMINB12 No results found for: TSH  CTA Head and neck 06/01/21 1. No acute intracranial pathology. 2. Patent vasculature of the head and neck with no stenosis, occlusion, dissection, or aneurysm.    ASSESSMENT AND PLAN  55 y.o. year old female with no reported past medical history who is presenting with headache for the past 59-month, which is worse now for the past 2 months.  She patient is describing daily headaches not improved with Excedrin migraine.  She also reported going thru a lot of stress, she is going through a divorce, currently lives with  her brother.  I believe patient have chronic tension type headache, but she is over 50 and I will get a MRI brain to rule out any intracranial pathology that can explain her headache.  I also think that she is under a lot of stress, possibly going to a depression which is worsening her headaches.  I will start her on amitriptyline with a plan to increase it to control her depressive symptoms (up to 200 mg).  I will contact the patient to discuss the MRI result otherwise I will see her in 3 months for follow-up.  I will also refer her for a primary care physician.    1. Chronic tension-type headache, intractable   2. Healthcare maintenance     PLAN: Stop Excedrin   Start Medrol dose pack as below  Day 1: Take 6 tabs in the morning  Day 2: Take 5 tabs in the morning  Day 3: Take 4 tabs in the morning  Day 4: Take 3 tabs in the morning  Day 5: Take 2 tabs in the morning  Day 6: Tale 1 tab in the morning  Start Elavil as below:  Week 1: Take 1/2 tablet nightly  Week 2: Take 1 tablet nightly  Week 3: Increase to 2 tablets nightly   Start Sumatriptan for severe pain, can take an additional table after 2 hours in the pain not resolved   Brain MRI with and without contrast, I will contact you to discuss MRI results   Return in 3 months   Orders Placed This Encounter  Procedures   [place]  Ambulatory referral to [facility] this encounter  Medications   methylPREDNISolone (MEDROL DOSEPAK) 4 MG TBPK tablet    Sig: Day 1: Take 6 tabs in the morning Day 2: Take 5 tabs in the morning Day 3: Take 4 tabs in the morning Day 4: Take 3 tabs in the morning Day 5: Take 2 tabs in the morning Day 6: Tale 1 tab in the morning    Dispense:  21 tablet    Refill:  0   amitriptyline (ELAVIL) 25 MG tablet    Sig: Take 1 tablet (25 mg total) by mouth at bedtime.    Dispense:  60 tablet    Refill:  1   SUMAtriptan (IMITREX) 50 MG  tablet    Sig: Take 1 tablet (50 mg  total) by mouth every 2 (two) hours as needed for migraine. May repeat in 2 hours if headache persists or recurs.    Dispense:  10 tablet    Refill:  0    Return in about 3 months (around 09/05/2021).    Alric Ran, MD 06/06/2021, 1:45 PM  [facility] [address] [place], [place] [address] [phone]

## 2021-06-18 ENCOUNTER — Telehealth: Payer: Self-pay | Admitting: Neurology

## 2021-06-18 NOTE — Telephone Encounter (Signed)
MR Brain w/wo contrast Dr. Sena Slate Berkley Harvey: [phone] (exp. 06/17/21 to 08/15/21). Patient is scheduled at [facility] for 06/25/21.

## 2021-06-25 ENCOUNTER — Ambulatory Visit: Payer: BC Managed Care – PPO

## 2021-06-25 DIAGNOSIS — G44221 Chronic tension-type headache, intractable: Secondary | ICD-10-CM

## 2021-06-25 MED ORDER — GADOBENATE DIMEGLUMINE 529 MG/ML IV SOLN
10.0000 mL | Freq: Once | INTRAVENOUS | Status: AC | PRN
  Administered 2021-06-25: 10 mL via INTRAVENOUS

## 2021-06-26 ENCOUNTER — Telehealth: Payer: Self-pay | Admitting: *Deleted

## 2021-06-26 NOTE — Progress Notes (Signed)
Please call and advise the patient that her recent scan we did was within normal limits. We did a brain MRI which showed no acute findings. In particular, there were no acute findings, such as a stroke, or mass or blood products. No further action is required on this test at this time. Please remind patient to keep any upcoming appointments or tests and to call us with any interim questions, concerns, problems or updates. Thanks,   Windell Norfolk, MD

## 2021-06-26 NOTE — Telephone Encounter (Signed)
-----   Message from Windell Norfolk, MD sent at 06/26/2021  1:55 PM EDT ----- Please call and advise the patient that her recent scan we did was within normal limits. We did a brain MRI which showed no acute findings. In particular, there were no acute findings, such as a stroke, or mass or blood products. No further action is required on this test at this time. Please remind patient to keep any upcoming appointments or tests and to call us with any interim questions, concerns, problems or updates. Thanks,   Windell Norfolk, MD

## 2021-06-26 NOTE — Telephone Encounter (Signed)
I spoke to the patient. She verbalized understanding of the MRI brain findings. She will keep pending appts.

## 2021-07-01 ENCOUNTER — Encounter: Payer: Self-pay | Admitting: Neurology

## 2021-07-22 DIAGNOSIS — M542 Cervicalgia: Secondary | ICD-10-CM | POA: Diagnosis not present

## 2021-07-22 DIAGNOSIS — M9902 Segmental and somatic dysfunction of thoracic region: Secondary | ICD-10-CM | POA: Diagnosis not present

## 2021-07-22 DIAGNOSIS — M9901 Segmental and somatic dysfunction of cervical region: Secondary | ICD-10-CM | POA: Diagnosis not present

## 2021-07-22 DIAGNOSIS — G4486 Cervicogenic headache: Secondary | ICD-10-CM | POA: Diagnosis not present

## 2021-07-31 DIAGNOSIS — M9902 Segmental and somatic dysfunction of thoracic region: Secondary | ICD-10-CM | POA: Diagnosis not present

## 2021-07-31 DIAGNOSIS — M542 Cervicalgia: Secondary | ICD-10-CM | POA: Diagnosis not present

## 2021-07-31 DIAGNOSIS — M9901 Segmental and somatic dysfunction of cervical region: Secondary | ICD-10-CM | POA: Diagnosis not present

## 2021-07-31 DIAGNOSIS — G4486 Cervicogenic headache: Secondary | ICD-10-CM | POA: Diagnosis not present

## 2021-08-05 DIAGNOSIS — G4486 Cervicogenic headache: Secondary | ICD-10-CM | POA: Diagnosis not present

## 2021-08-05 DIAGNOSIS — M542 Cervicalgia: Secondary | ICD-10-CM | POA: Diagnosis not present

## 2021-08-05 DIAGNOSIS — M9901 Segmental and somatic dysfunction of cervical region: Secondary | ICD-10-CM | POA: Diagnosis not present

## 2021-08-05 DIAGNOSIS — M9902 Segmental and somatic dysfunction of thoracic region: Secondary | ICD-10-CM | POA: Diagnosis not present

## 2021-08-22 DIAGNOSIS — M9901 Segmental and somatic dysfunction of cervical region: Secondary | ICD-10-CM | POA: Diagnosis not present

## 2021-08-22 DIAGNOSIS — M9902 Segmental and somatic dysfunction of thoracic region: Secondary | ICD-10-CM | POA: Diagnosis not present

## 2021-08-22 DIAGNOSIS — M542 Cervicalgia: Secondary | ICD-10-CM | POA: Diagnosis not present

## 2021-08-22 DIAGNOSIS — G4486 Cervicogenic headache: Secondary | ICD-10-CM | POA: Diagnosis not present

## 2021-09-05 ENCOUNTER — Ambulatory Visit: Payer: BC Managed Care – PPO | Admitting: Neurology

## 2021-09-10 ENCOUNTER — Encounter: Payer: Self-pay | Admitting: Neurology

## 2021-09-10 ENCOUNTER — Ambulatory Visit (INDEPENDENT_AMBULATORY_CARE_PROVIDER_SITE_OTHER): Payer: BC Managed Care – PPO | Admitting: Neurology

## 2021-09-10 VITALS — BP 134/95 | HR 83 | Ht <= 58 in | Wt 98.5 lb

## 2021-09-10 DIAGNOSIS — G44221 Chronic tension-type headache, intractable: Secondary | ICD-10-CM | POA: Diagnosis not present

## 2021-09-10 MED ORDER — SUMATRIPTAN SUCCINATE 50 MG PO TABS: 50.0000 mg | ORAL_TABLET | ORAL | 3 refills | Status: AC | PRN

## 2021-09-10 MED ORDER — GABAPENTIN 100 MG PO CAPS: 100.0000 mg | ORAL_CAPSULE | Freq: Every evening | ORAL | 0 refills | Status: AC

## 2021-09-10 NOTE — Patient Instructions (Signed)
Discontinue amitriptyline (Sided effect of drowsiness)  Trial of Gabapentin 100 mg nightly  Continue with Imitrex as needed for the headaches  Set up a primary care physician  Return in 6 months or sooner if worse

## 2021-09-10 NOTE — Progress Notes (Signed)
[facility]  PATIENT: Erin Huynh DOB: 17-Jun-1966  REFERRING CLINICIAN: No ref. provider found HISTORY FROM: Patient  REASON FOR VISIT: Daily Headaches    HISTORICAL  CHIEF COMPLAINT:  Chief Complaint  Patient presents with   Follow-up    Rm 13. Alone. Pt states she is not taking amitriptyline due to extreme drowsiness. Pt reports daily headaches. Headaches are not as intense as last visit.     INTERVAL HISTORY 09/10/2021:  Patient presents for follow-up, last visit was in September, at that time plan was to start amitriptyline and to obtain a brain MRI.  Brain MRI was obtained and was normal, no acute intracranial abnormality.  She did tried amitriptyline 25 mg but caused extreme drowsiness.  She even cut the medication to a quarter but it caused extreme drowsiness for couple days therefore she discontinued it.  Patient did not call the office to let us know.  She reports overall she is feeling a little better, she is going to a chiropractor which seems to help with her headaches.  She also started the gym and quit smoking.  She still taking Excedrin Migraine daily, but will from 4 times a day to once a day.  She also takes the sumatriptan as needed for headaches.  Has not tried any other preventive medication for headaches.   Reported stress still high but is manageable she put her faith in God.  She still lives with a brother and still working.      HISTORY OF PRESENT ILLNESS:  This is a 55 year old woman with no reported past medical history who is presenting with daily headaches.  Patient stated for the past 6 to 7 months has been having migraine headaches but in the last 2 months has been more frequent.  She is having daily headaches.  Headaches usually start in the back of the neck on the right side, described as sharp pain that would radiate to the top of the head.  On occasion she will have bifrontal headaches.  She reports has been taking Excedrin  migraine headache every 4-6 hours every day for her headaches.   She is also complaining of right-sided neck pain which is getting worse.  With the headaches she has nausea but only on occasion but no vomiting no photophobia no phonophobia.  She was seen in the ED recently on this September 17 for the same headaches, had a CTA head and neck which was negative did not show any evidence of aneurysm.   On further questioning patient stated her mood is little low, she is undergoing a lot of stress.  She left her husband 2 years ago, she is living with her brother in the basement.  Stated that she started her divorce paper 36-monthago but is been difficult for her.  She currently works second shift between 20 11 PM.  Also reported her sleep is terrible preparation.  She goes to bed around midnight and she will wake up almost every hour. She usually does not see a doctor, she does not have a primary care doctor currently.   Headache History and Characteristics: Onset: 7 months ago, worse in the past 2 months. Location: back of head, in the right side, will shoot pain on top. Every once a while, headaches are frontal Quality: stabbing, throbbing.  Intensity: 7/10.  Duration: Constant for the past 2 months,  Migrainous Features: nausea  Aura: No  History of brain injury or tumor: No  Family history:  Mother with migraines, grandmother (Brain aneurysm) Motion sickness: no Cardiac history: no  OTC: tylenol, Excedrin, Mucinex   Caffeine: yes  Sleep: Not  good, work second shift 3 -11, goes to bed by midnight, but will wake every hour Mood/ Stress: Live with brother, going thru a divorce,   Prior prophylaxis: Propranolol: No  Verapamil:No TCA: No Topamax: No Depakote: No Effexor: No Cymbalta: No Neurontin:No  Prior abortives: Triptan: No Anti-emetic: No Steroids: No Ergotamine suppository: No  Prior interventions Trigger point injections: botox injections:   OTHER MEDICAL CONDITIONS:  None    REVIEW OF SYSTEMS: Full 14 system review of systems performed and negative with exception of: as noted in the HPI  ALLERGIES: Allergies  Allergen Reactions   Tramadol     HOME MEDICATIONS: Outpatient Medications Prior to Visit  Medication Sig Dispense Refill   aspirin-acetaminophen-caffeine (EXCEDRIN MIGRAINE) 250-250-65 MG tablet Take by mouth every 6 (six) hours as needed for headache.     SUMAtriptan (IMITREX) 50 MG tablet Take 1 tablet (50 mg total) by mouth every 2 (two) hours as needed for migraine. May repeat in 2 hours if headache persists or recurs. 10 tablet 0   amitriptyline (ELAVIL) 25 MG tablet Take 1 tablet (25 mg total) by mouth at bedtime. 60 tablet 1   methylPREDNISolone (MEDROL DOSEPAK) 4 MG TBPK tablet Day 1: Take 6 tabs in the morning Day 2: Take 5 tabs in the morning Day 3: Take 4 tabs in the morning Day 4: Take 3 tabs in the morning Day 5: Take 2 tabs in the morning Day 6: Tale 1 tab in the morning 21 tablet 0   No facility-administered medications prior to visit.    PAST MEDICAL HISTORY: Past Medical History:  Diagnosis Date   Anemia    Hypoglycemia     PAST SURGICAL HISTORY: Past Surgical History:  Procedure Laterality Date   CESAREAN SECTION     [phone]    FAMILY HISTORY: Family History  Problem Relation Age of Onset   Colon cancer Maternal Grandfather 64   Skin cancer Paternal Grandfather 72   Breast cancer Neg Hx     SOCIAL HISTORY: Social History   Socioeconomic History   Marital status: Married    Spouse name: Not on file   Number of children: Not on file   Years of education: Not on file   Highest education level: Not on file  Occupational History   Not on file  Tobacco Use   Smoking status: Former   Smokeless tobacco: Never  Vaping Use   Vaping Use: Never used  Substance and Sexual Activity   Alcohol use: No   Drug use: Never   Sexual activity: Not Currently    Birth control/protection: None  Other Topics  Concern   Not on file  Social History Narrative   Not on file   Social Determinants of Health   Financial Resource Strain: Not on file  Food Insecurity: Not on file  Transportation Needs: Not on file  Physical Activity: Not on file  Stress: Not on file  Social Connections: Not on file  Intimate Partner Violence: Not on file     PHYSICAL EXAM  GENERAL EXAM/CONSTITUTIONAL: Vitals:  Vitals:   09/10/21 1252  BP: (!) 134/95  Pulse: 83  Weight: 98 lb 8 oz (44.7 kg)  Height: 4' 8" (1.422 m)    Body mass index is 22.08 kg/m. Wt Readings from Last 3 Encounters:  09/10/21 98 lb 8 oz (44.7 kg)  06/06/21 99 lb (44.9 kg)  06/01/21 95 lb (43.1 kg)   Patient is in no distress; well developed, nourished and groomed; neck is supple but tearful and crying during examination  EYES: Pupils round and reactive to light, Visual fields full to confrontation, Extraocular movements intacts,   MUSCULOSKELETAL: Gait, strength, tone, movements noted in Neurologic exam below  NEUROLOGIC: MENTAL STATUS:  awake, alert, oriented to person, place and time recent and remote memory intact normal attention and concentration language fluent, comprehension intact, naming intact fund of knowledge appropriate  CRANIAL NERVE:  2nd - no papilledema or hemorrhages on fundoscopic exam 2nd, 3rd, 4th, 6th - pupils equal and reactive to light, visual fields full to confrontation, extraocular muscles intact, no nystagmus 5th - facial sensation symmetric 7th - facial strength symmetric 8th - hearing intact 9th - palate elevates symmetrically, uvula midline 11th - shoulder shrug symmetric 12th - tongue protrusion midline  MOTOR:  normal bulk and tone, full strength in the BUE, BLE  SENSORY:  normal and symmetric to light touch  COORDINATION:  finger-nose-finger, fine finger movements normal  REFLEXES:  deep tendon reflexes present and symmetric  GAIT/STATION:  normal    DIAGNOSTIC DATA  (LABS, IMAGING, TESTING) - I reviewed patient records, labs, notes, testing and imaging myself where available.  Lab Results  Component Value Date   WBC 9.8 06/01/2021   HGB 13.0 06/01/2021   HCT 40.0 06/01/2021   MCV 87.1 06/01/2021   PLT 258 06/01/2021      Component Value Date/Time   NA 139 06/01/2021 1534   K 3.9 06/01/2021 1534   CL 103 06/01/2021 1534   CO2 27 06/01/2021 1534   GLUCOSE 92 06/01/2021 1534   BUN 13 06/01/2021 1534   CREATININE 0.55 06/01/2021 1534   CALCIUM 9.9 06/01/2021 1534   PROT 7.1 06/01/2021 1534   ALBUMIN 4.2 06/01/2021 1534   AST 17 06/01/2021 1534   ALT 15 06/01/2021 1534   ALKPHOS 56 06/01/2021 1534   BILITOT 0.2 (L) 06/01/2021 1534   GFRNONAA >60 06/01/2021 1534   GFRAA  12/16/2007 1030    >60        The eGFR has been calculated using the MDRD equation. This calculation has not been validated in all clinical   No results found for: CHOL, HDL, LDLCALC, LDLDIRECT, TRIG, CHOLHDL No results found for: HGBA1C No results found for: VITAMINB12 No results found for: TSH  CTA Head and neck 06/01/21 1. No acute intracranial pathology. 2. Patent vasculature of the head and neck with no stenosis, occlusion, dissection, or aneurysm.    MRI Brain with and without contrast 06/25/2021 This is a normal MRI of the brain with and without contrast  ASSESSMENT AND PLAN  55 y.o. year old female with past medical history of chronic headaches who is presenting for follow up.  Headache is still present, she continues on Excedrin but decreased from 4 times a day to daily.  She had tried amitriptyline but due to extreme tiredness have self discontinued the medication.  Currently she is not taking any preventive medication.  She is also on sumatriptan as needed for severe headache.  At this point I discussed with the patient of starting a preventive medication, since she is very sensitive to medication we have opted to give a trial of gabapentin 100 mg nightly.   Patient to contact me for additional refill if medication is helpful without side effects.  I will see her in 23-month for follow-up.  I also  advised patient to set up care with her primary care doctor.  She voices understanding   1. Chronic tension-type headache, intractable     PLAN: Patient Instructions  Discontinue amitriptyline (Sided effect of drowsiness)  Trial of Gabapentin 100 mg nightly  Continue with Imitrex as needed for the headaches  Set up a primary care physician  Return in 6 months or sooner if worse     No orders of the defined types were placed in this encounter.   Meds ordered this encounter  Medications   SUMAtriptan (IMITREX) 50 MG tablet    Sig: Take 1 tablet (50 mg total) by mouth every 2 (two) hours as needed for migraine. May repeat in 2 hours if headache persists or recurs.    Dispense:  10 tablet    Refill:  3   gabapentin (NEURONTIN) 100 MG capsule    Sig: Take 1 capsule (100 mg total) by mouth at bedtime.    Dispense:  30 capsule    Refill:  0    Return in about 6 months (around 03/11/2022).    Alric Ran, MD 09/10/2021, 1:25 PM  [facility] [address] [place], [place] [address] [phone]

## 2022-03-11 ENCOUNTER — Ambulatory Visit: Payer: BC Managed Care – PPO | Admitting: Neurology

## 2022-03-11 ENCOUNTER — Encounter: Payer: Self-pay | Admitting: Neurology

## 2023-03-18 IMAGING — CT CT ANGIO HEAD
3 of 11 series · 8 of 34 positions shown · IV contrast (APPLIED)
Comparison: None.

CLINICAL DATA: Headache and neck pain for 2 months, migraine

EXAM:
CT ANGIOGRAPHY HEAD AND NECK
TECHNIQUE: Multidetector CT imaging of the head and neck was performed using
the standard protocol during bolus administration of intravenous
contrast. Multiplanar CT image reconstructions and MIPs were
obtained to evaluate the vascular anatomy. Carotid stenosis
measurements (when applicable) are obtained utilizing NASCET
criteria, using the distal internal carotid diameter as the
denominator.
CONTRAST:  75mL OMNIPAQUE IOHEXOL 350 MG/ML SOLN

[Series 7: sagittal soft · sagittal · 0.30mm/px · 1 of 51 slices shown]
[im 4/51  soft-tissue]
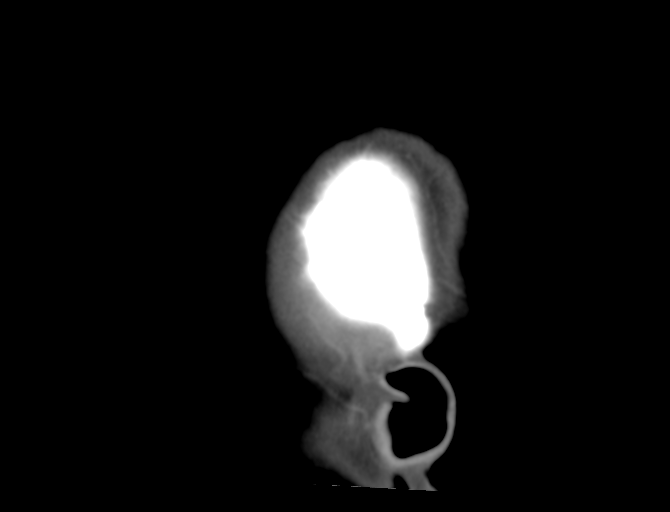

[Series 8: cta head · axial · 0.55mm/px · z∈[-324,-222]mm · 2 of 154 slices shown]
[im 52/154  soft-tissue]
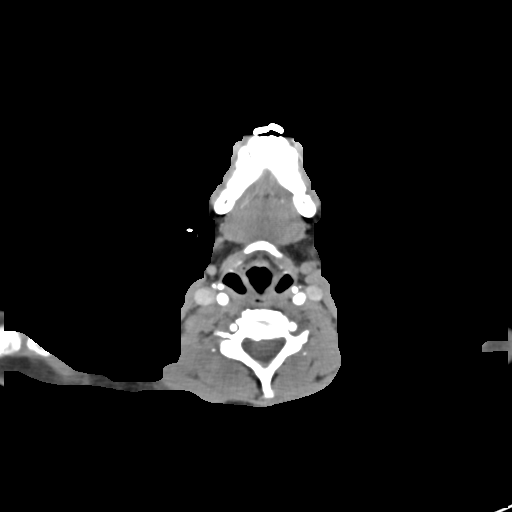
[im 103/154  soft-tissue]
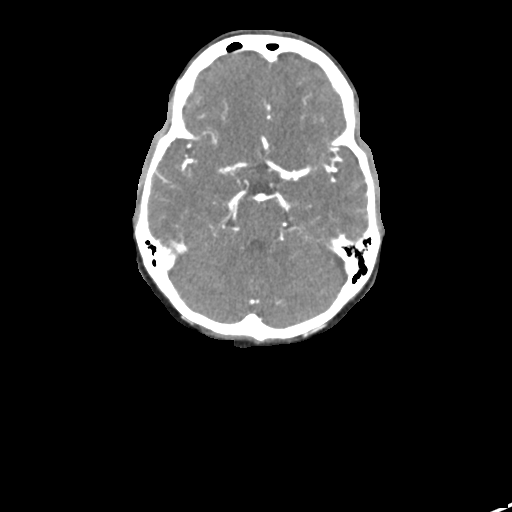

[Series 10: ax thin · axial · 0.54mm/px · z∈[-376,-175]mm · 5 of 303 slices shown]
[im 51/303  soft-tissue]
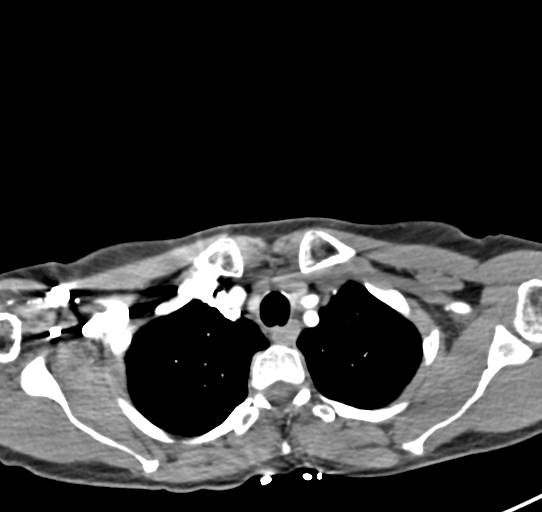
[im 101/303  bone]
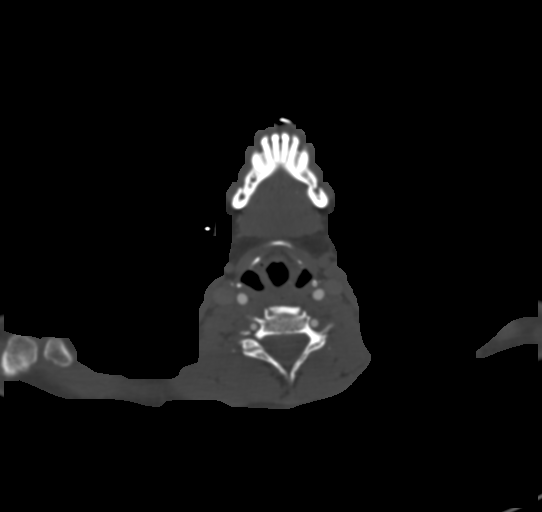
[im 152/303  soft-tissue]
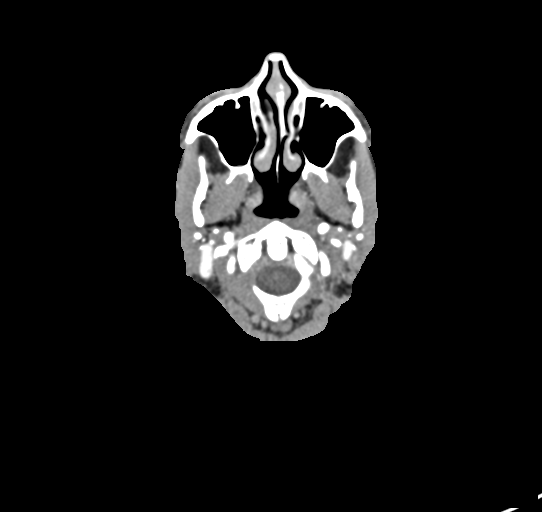
[im 202/303  bone]
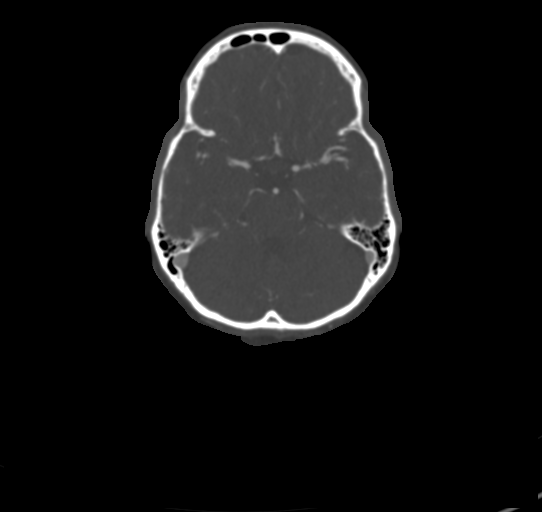
[im 252/303  soft-tissue]
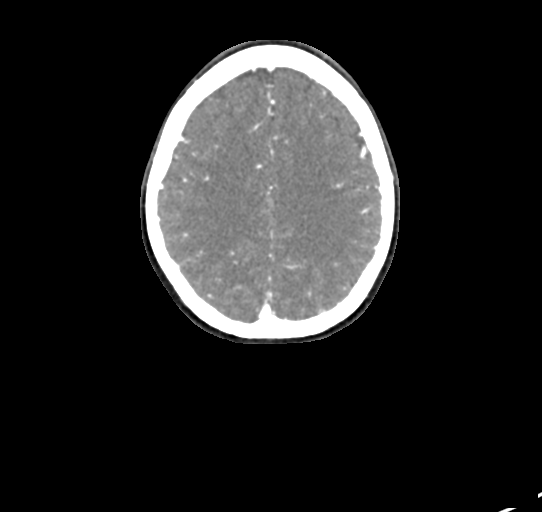

[8 of 34 positions shown; findings below may reference images not displayed]

FINDINGS: CT HEAD FINDINGS

Brain: There is no evidence of acute intracranial hemorrhage,
extra-axial fluid collection, or acute infarct. The ventricles are
not enlarged. There is no mass lesion. There is no midline shift.
There is no significant burden of chronic white matter
microangiopathy.

Vascular: See below

Skull: Normal. Negative for fracture or focal lesion.

Sinuses: Imaged portions are clear.

Orbits: The paranasal sinuses are clear. The globes and orbits are
unremarkable.

Review of the MIP images confirms the above findings

CTA NECK FINDINGS

Aortic arch: Standard branching. Imaged portion shows no evidence of
aneurysm or dissection. No significant stenosis of the major arch
vessel origins.

Right carotid system: No evidence of dissection, stenosis (50% or
greater) or occlusion.

Left carotid system: No evidence of dissection, stenosis (50% or
greater) or occlusion.

Vertebral arteries: Codominant. No evidence of dissection, stenosis
(50% or greater) or occlusion.

Skeleton: There is mild multilevel degenerative change of the
cervical spine. There is mild degenerative change of the
temporomandibular joints. There is no acute osseous abnormality or
aggressive osseous lesion.

Other neck: The soft tissues are unremarkable.

Upper chest: There is mild scarring lung apices.

Review of the MIP images confirms the above findings

CTA HEAD FINDINGS

Anterior circulation: The bilateral intracranial ICAs are patent.
The bilateral MCAs and ACAs are patent. There is no hemodynamically
significant stenosis or occlusion. There is no aneurysm.

Posterior circulation: The V4 segments of the vertebral arteries are
patent. The basilar artery is patent. The bilateral PCAs are patent.
The bilateral posterior communicating arteries are identified.

Venous sinuses: Patent.

Anatomic variants: None.

Review of the MIP images confirms the above findings
IMPRESSION: 1. No acute intracranial pathology.
2. Patent vasculature of the head and neck with no stenosis,
occlusion, dissection, or aneurysm.
